# Patient Record
Sex: Female | Born: 1940 | Race: White | Hispanic: No | Smoking: Never smoker
Health system: Southern US, Community
[De-identification: ages and names within clinical notes are randomized; demographics above are authoritative.]

## PROBLEM LIST (undated history)

## (undated) DIAGNOSIS — I10 Essential (primary) hypertension: Secondary | ICD-10-CM

## (undated) HISTORY — PX: CHOLECYSTECTOMY: SHX55

---

## 2016-07-31 ENCOUNTER — Encounter (HOSPITAL_BASED_OUTPATIENT_CLINIC_OR_DEPARTMENT_OTHER): Payer: Self-pay | Admitting: *Deleted

## 2016-07-31 ENCOUNTER — Emergency Department (HOSPITAL_BASED_OUTPATIENT_CLINIC_OR_DEPARTMENT_OTHER)
Admission: EM | Admit: 2016-07-31 | Discharge: 2016-07-31 | Disposition: A | Discharge status: Home / Self Care | Payer: Medicare Other | Attending: Emergency Medicine | Admitting: Emergency Medicine

## 2016-07-31 ENCOUNTER — Emergency Department (HOSPITAL_BASED_OUTPATIENT_CLINIC_OR_DEPARTMENT_OTHER): Payer: Medicare Other

## 2016-07-31 DIAGNOSIS — I10 Essential (primary) hypertension: Secondary | ICD-10-CM | POA: Insufficient documentation

## 2016-07-31 DIAGNOSIS — Z79899 Other long term (current) drug therapy: Secondary | ICD-10-CM | POA: Diagnosis not present

## 2016-07-31 DIAGNOSIS — Y92009 Unspecified place in unspecified non-institutional (private) residence as the place of occurrence of the external cause: Secondary | ICD-10-CM | POA: Insufficient documentation

## 2016-07-31 DIAGNOSIS — Y939 Activity, unspecified: Secondary | ICD-10-CM | POA: Diagnosis not present

## 2016-07-31 DIAGNOSIS — W108XXA Fall (on) (from) other stairs and steps, initial encounter: Secondary | ICD-10-CM | POA: Diagnosis not present

## 2016-07-31 DIAGNOSIS — S52531A Colles' fracture of right radius, initial encounter for closed fracture: Secondary | ICD-10-CM | POA: Diagnosis not present

## 2016-07-31 DIAGNOSIS — Y999 Unspecified external cause status: Secondary | ICD-10-CM | POA: Diagnosis not present

## 2016-07-31 DIAGNOSIS — S6991XA Unspecified injury of right wrist, hand and finger(s), initial encounter: Secondary | ICD-10-CM | POA: Diagnosis present

## 2016-07-31 HISTORY — DX: Essential (primary) hypertension: I10

## 2016-07-31 MED ORDER — OXYCODONE HCL 5 MG PO TABS
2.5000 mg | ORAL_TABLET | Freq: Once | ORAL | Status: AC
  Administered 2016-07-31: 2.5 mg via ORAL
  Filled 2016-07-31: qty 1

## 2016-07-31 MED ORDER — ACETAMINOPHEN 500 MG PO TABS
1000.0000 mg | ORAL_TABLET | Freq: Once | ORAL | Status: AC
  Administered 2016-07-31: 1000 mg via ORAL
  Filled 2016-07-31: qty 2

## 2016-07-31 MED ORDER — IBUPROFEN 800 MG PO TABS
800.0000 mg | ORAL_TABLET | Freq: Once | ORAL | Status: AC
  Administered 2016-07-31: 800 mg via ORAL
  Filled 2016-07-31: qty 1

## 2016-07-31 MED ORDER — MORPHINE SULFATE 15 MG PO TABS: 15.0000 mg | ORAL_TABLET | ORAL | 0 refills | Status: AC | PRN

## 2016-07-31 NOTE — ED Notes (Signed)
ED Provider at bedside. 

## 2016-07-31 NOTE — ED Notes (Signed)
Pt refused fiberglass splint. PMS intact before and after. Pt tolerated well. No questions asked.

## 2016-07-31 NOTE — ED Triage Notes (Signed)
Pt c/o fall with right wrist injury x 3 hrs ago 

## 2016-07-31 NOTE — ED Provider Notes (Signed)
[facility] Note   CSN: [id number] Arrival date & time: 07/31/16  1754  By signing my name below, I, [name], attest that this documentation has been prepared under the direction and in the presence of physician practitioner, Melene Plan, DO. Electronically Signed: [name], Scribe. 07/31/2016. 6:40 PM.  History   Chief Complaint Chief Complaint  Patient presents with  . Wrist Injury   The history is provided by the patient. No language interpreter was used.  Wrist Injury   The incident occurred 3 to 5 hours ago. The incident occurred at home. The injury mechanism was a fall. The pain is present in the right wrist. The quality of the pain is described as aching. The pain is moderate. The pain has been constant since the incident. Pertinent negatives include no fever and no malaise/fatigue. She reports no foreign bodies present. The symptoms are aggravated by movement, palpation and use. She has tried nothing for the symptoms.   HPI Comments: Lynn Wise is a 76 y.o. female with PMHx of HTN who presents to the Emergency Department complaining of a right wrist injury s/p a mechanical fall that occurred at 3 PM this afternoon. She states she fell down some steps and landed on her outstretched right hand. No head trauma or LOC. Patient reports significant pain and swelling to her right wrist. She endorses pain exacerbation with movement of and applied pressure to her right wrist. No medications or treatments tried PTA. She denies numbness/tingling, wounds, or any other associated symptoms.  Past Medical History:  Diagnosis Date  . Hypertension     There are no active problems to display for this patient.   Past Surgical History:  Procedure Laterality Date  . CHOLECYSTECTOMY      OB History    No data available       Home Medications    Prior to Admission medications   Medication Sig Start Date End Date Taking? Authorizing Provider  amLODipine  (NORVASC) 5 MG tablet Take 5 mg by mouth daily.   Yes Historical Provider, MD  morphine (MSIR) 15 MG tablet Take 1 tablet (15 mg total) by mouth every 4 (four) hours as needed for severe pain. 07/31/16   Melene Plan, DO    Family History History reviewed. No pertinent family history.  Social History Social History  Substance Use Topics  . Smoking status: Never Smoker  . Smokeless tobacco: Never Used  . Alcohol use No     Allergies   Patient has no known allergies.   Review of Systems Review of Systems  Constitutional: Negative for fever and malaise/fatigue.  Musculoskeletal: Positive for arthralgias and joint swelling.  Skin: Negative for wound.  Neurological: Negative for syncope and numbness.  All other systems reviewed and are negative.  Physical Exam Updated Vital Signs BP (!) 182/86   Pulse 76   Temp 97.8 F (36.6 C)   Resp 16   Ht  (1.575 m)   Wt 157 lb (71.2 kg)   SpO2 100%   BMI 28.72 kg/m   Physical Exam  Constitutional: She is oriented to person, place, and time. She appears well-developed and well-nourished. No distress.  HENT:  Head: Normocephalic and atraumatic.  Eyes: EOM are normal. Pupils are equal, round, and reactive to light.  Neck: Normal range of motion. Neck supple.  Cardiovascular: Normal rate and regular rhythm.  Exam reveals no gallop and no friction rub.   No murmur heard. Pulmonary/Chest: Effort normal. She has no  wheezes. She has no rales.  Abdominal: Soft. She exhibits no distension. There is no tenderness.  Musculoskeletal:  Right wrist: Swelling to the distal radius and the carpal bones at the base of the thumb and index finger. Pulse, motor, and sensation are intact distally.  Neurological: She is alert and oriented to person, place, and time.  Skin: Skin is warm and dry. She is not diaphoretic.  Psychiatric: She has a normal mood and affect. Her behavior is normal.  Nursing note and vitals reviewed.  ED Treatments / Results    Labs (all labs ordered are listed, but only abnormal results are displayed) Labs Reviewed - No data to display  EKG  EKG Interpretation None       Radiology Dg Wrist Complete Right  Result Date: 07/31/2016 CLINICAL DATA:  Larey Seat on outstretched right hand today. Right wrist pain. EXAM: RIGHT WRIST - COMPLETE 3+ VIEW COMPARISON:  None. FINDINGS: There is transverse fracture of the distal radial metaphysis with associated ulnar styloid fracture. The distal radial fracture shows dorsal comminution and impaction. Portion of the fracture extends to the dorsal articular surface of the distal radius. Dorsal angulation of the articular surface measures approximately 10 degrees. There is no displacement of the ulnar styloid fracture. No dislocation. 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 Diffuse soft tissue swelling is noted. IMPRESSION: Fractures of the distal radial metaphysis, with an intra-articular component and dorsal angulation of the articular surface. Nondisplaced ulnar styloid fracture. No dislocation. Electronically Signed   By: Amie Portland M.D.   On: 07/31/2016 18:32    Procedures Procedures (including critical care time)  DIAGNOSTIC STUDIES: Oxygen Saturation is 100% on RA, normal by my interpretation.    Medications Ordered in ED Medications  acetaminophen (TYLENOL) tablet 1,000 mg (1,000 mg Oral Given 07/31/16 1848)  ibuprofen (ADVIL,MOTRIN) tablet 800 mg (800 mg Oral Given 07/31/16 1848)  oxyCODONE (Oxy IR/ROXICODONE) immediate release tablet 2.5 mg (2.5 mg Oral Given 07/31/16 1848)     Initial Impression / Assessment and Plan / ED Course  I have reviewed the triage vital signs and the nursing notes.  Pertinent labs & imaging results that were available during my care of the patient were reviewed by me and considered in my medical decision making (see chart for details).     76 yo F With a chief complaint of right wrist pain. The patient  had a mechanical fall and reached to stop herself and grabbed rail and then had significant pain to her right wrist. GERD couple hours ago. Significant swelling into the area. On exam patient with focal tenderness about the distal radius. She also has some significant swelling over the scaphoid area. X-ray with distal radius fracture with extension into the joint. She also has a fracture of the ulnar styloid. Scaphoid appears to have chronic changes but no acute fracture that I can see. Upon discussing the results with the patient she told me definitively that she was not going to have surgery regardless of what was recommended. She also told me that she would not use a fiberglass splint and requested a removable one. I discussed that the concern would be be difficult to remove the splint and she may move the intra-articular fragments and cause severe pain and maybe an open fracture. The patient understood this but again demanded that she have a removable splint placed. Hand follow-up.  7:39 PM:  I have discussed the diagnosis/risks/treatment options with the patient and family and believe the  pt to be eligible for discharge home to follow-up with PCP. We also discussed returning to the ED immediately if new or worsening sx occur. We discussed the sx which are most concerning (e.g., sudden worsening pain, fever, inability to tolerate by mouth) that necessitate immediate return. Medications administered to the patient during their visit and any new prescriptions provided to the patient are listed below.  Medications given during this visit Medications  acetaminophen (TYLENOL) tablet 1,000 mg (1,000 mg Oral Given 07/31/16 1848)  ibuprofen (ADVIL,MOTRIN) tablet 800 mg (800 mg Oral Given 07/31/16 1848)  oxyCODONE (Oxy IR/ROXICODONE) immediate release tablet 2.5 mg (2.5 mg Oral Given 07/31/16 1848)     The patient appears reasonably screen and/or stabilized for discharge and I doubt any other medical condition or  other [facility] requiring further screening, evaluation, or treatment in the ED at this time prior to discharge.    Final Clinical Impressions(s) / ED Diagnoses   Final diagnoses:  Closed Colles' fracture of right radius, initial encounter    New Prescriptions Discharge Medication List as of 07/31/2016  6:58 PM    START taking these medications   Details  morphine (MSIR) 15 MG tablet Take 1 tablet (15 mg total) by mouth every 4 (four) hours as needed for severe pain., Starting Wed 07/31/2016, Print       I personally performed the services described in this documentation, which was scribed in my presence. The recorded information has been reviewed and is accurate.     Melene Plan, DO 07/31/16 1939

## 2016-07-31 NOTE — Discharge Instructions (Signed)
Take tylenol 1000mg(2 extra strength) four times a day.  ° °Then take the pain medicine if you feel like you need it. Narcotics do not help with the pain, they only make you care about it less.  You can become addicted to this, people may break into your house to steal it.  It will constipate you.  If you drive under the influence of this medicine you can get a DUI.   ° °

## 2016-07-31 NOTE — ED Notes (Signed)
Pt states "I cannot have a splint that I cannot take off." EMT informed pt that her wrist is broken and needs to be immobilized and that she will not be able to take the splint off. Pt states "I cannot do that." MD called back into room to explain to pt in more details. Pt still states "I cannot wear a splint I cannot remove." Per MD pt is to be put into a removable forearm splint.

## 2018-11-19 IMAGING — DX DG WRIST COMPLETE 3+V*R*
4 series · 4 of 4 positions shown · non-contrast
Comparison: None.

CLINICAL DATA: Fell on outstretched right hand today. Right wrist
pain.

EXAM:
RIGHT WRIST - COMPLETE 3+ VIEW

[wrist pa]
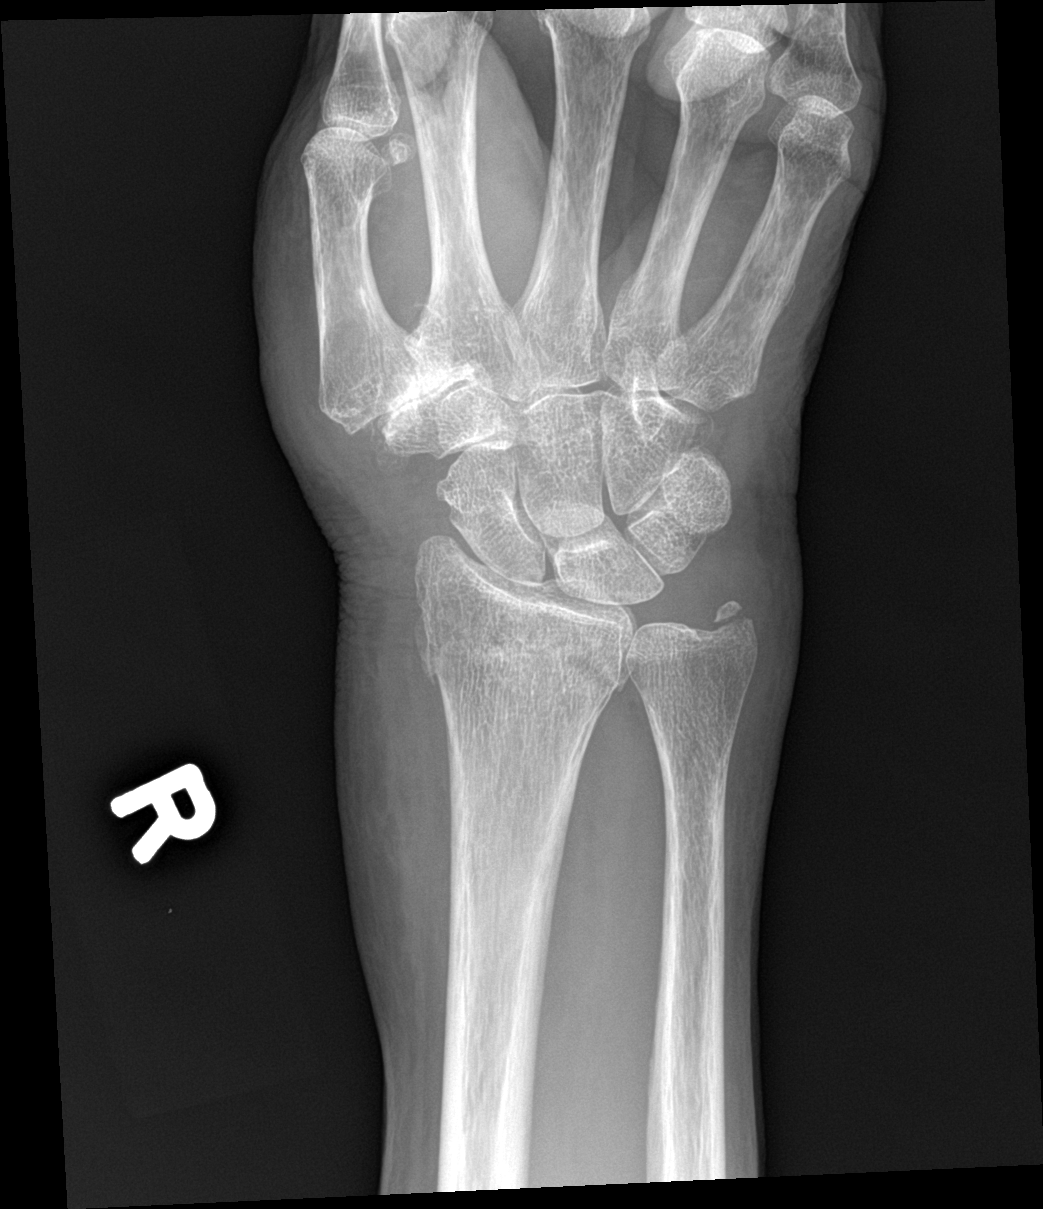

[wrist obl]
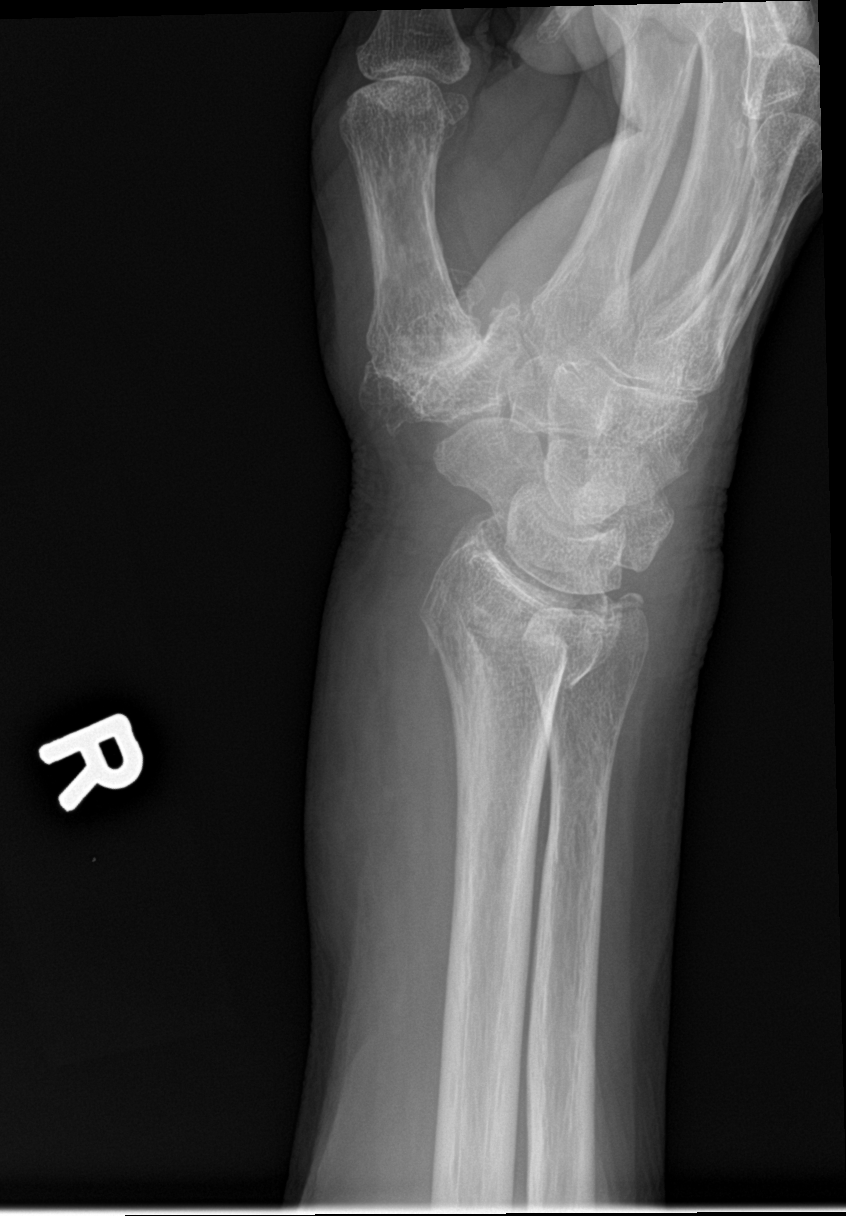

[wrist lat]
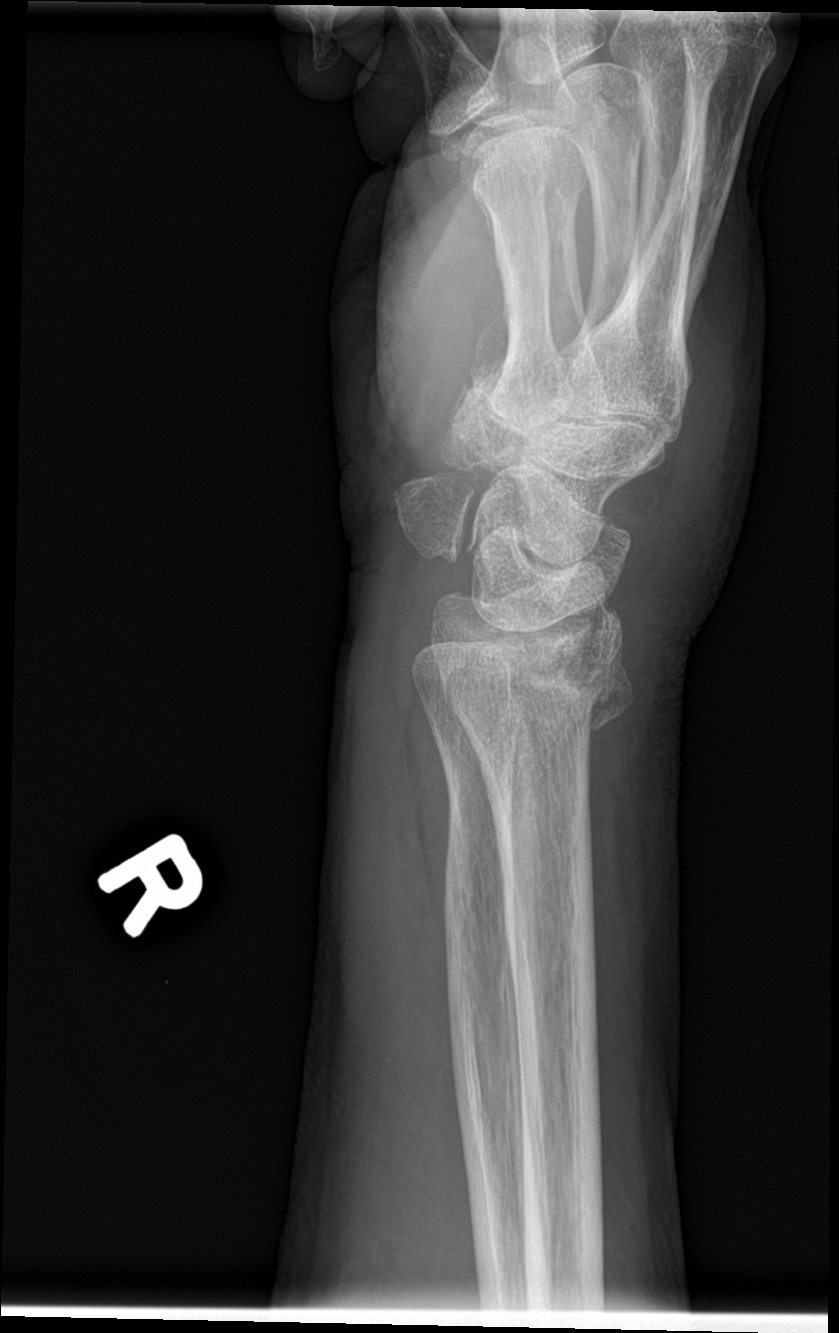

[wrist navicular]
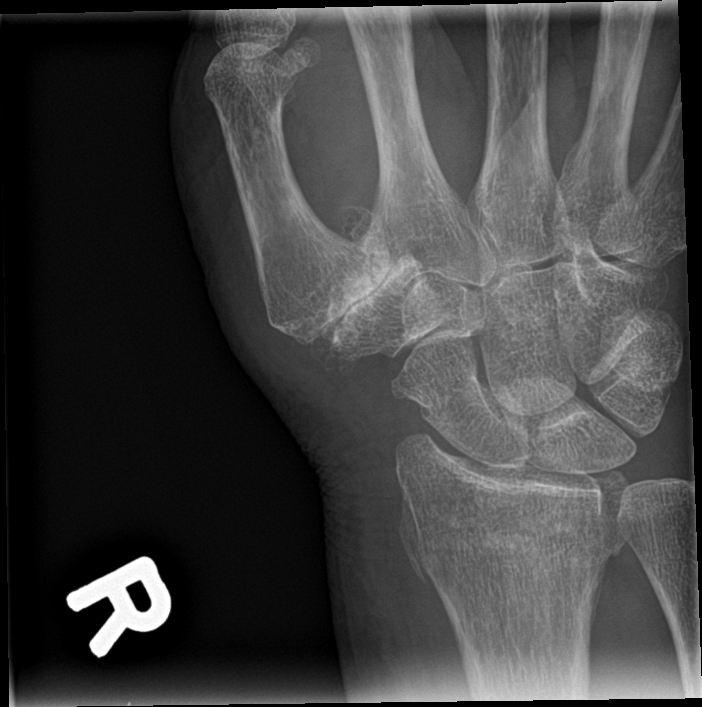

[4 of 4 positions shown; findings below may reference images not displayed]

FINDINGS: There is transverse fracture of the distal radial metaphysis with
associated ulnar styloid fracture.

The distal radial fracture shows dorsal comminution and impaction.
Portion of the fracture extends to the dorsal articular surface of
the distal radius. Dorsal angulation of the articular surface
measures approximately 10 degrees. There is no displacement of the
ulnar styloid fracture.

No dislocation.

There are osteoarthritic changes at the first carpal metacarpal
articulation.

Diffuse soft tissue swelling is noted.
IMPRESSION: Fractures of the distal radial metaphysis, with an intra-articular
component and dorsal angulation of the articular surface.
Nondisplaced ulnar styloid fracture. No dislocation.
